# Patient Record
Sex: Male | Born: 2000 | Race: Black or African American | Hispanic: No | Marital: Single | Smoking: Current every day smoker
Health system: Southern US, Community
[De-identification: ages and names within clinical notes are randomized; demographics above are authoritative.]

## PROBLEM LIST (undated history)

## (undated) DIAGNOSIS — F909 Attention-deficit hyperactivity disorder, unspecified type: Secondary | ICD-10-CM

---
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
## 2021-09-20 ENCOUNTER — Emergency Department (HOSPITAL_COMMUNITY)
Admission: EM | Admit: 2021-09-20 | Discharge: 2021-09-21 | Disposition: A | Discharge status: Home / Self Care | Payer: Medicaid Other | Attending: Emergency Medicine | Admitting: Emergency Medicine

## 2021-09-20 ENCOUNTER — Emergency Department (HOSPITAL_COMMUNITY): Payer: Medicaid Other

## 2021-09-20 ENCOUNTER — Other Ambulatory Visit: Payer: Self-pay

## 2021-09-20 DIAGNOSIS — M25512 Pain in left shoulder: Secondary | ICD-10-CM | POA: Diagnosis present

## 2021-09-20 DIAGNOSIS — W228XXA Striking against or struck by other objects, initial encounter: Secondary | ICD-10-CM | POA: Diagnosis not present

## 2021-09-20 MED ORDER — IBUPROFEN 800 MG PO TABS
800.0000 mg | ORAL_TABLET | Freq: Once | ORAL | Status: AC
  Administered 2021-09-20: 800 mg via ORAL
  Filled 2021-09-20: qty 1
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
## 2021-09-21 MED ORDER — OXYCODONE-ACETAMINOPHEN 5-325 MG PO TABS
2.0000 | ORAL_TABLET | Freq: Once | ORAL | Status: AC
  Administered 2021-09-21: 2 via ORAL
  Filled 2021-09-21: qty 2

## 2021-09-21 MED ORDER — LIDOCAINE 5 % EX PTCH
1.0000 | MEDICATED_PATCH | CUTANEOUS | Status: DC
  Administered 2021-09-21: 1 via TRANSDERMAL
  Filled 2021-09-21: qty 1

## 2021-09-21 MED ORDER — KETOROLAC TROMETHAMINE 60 MG/2ML IM SOLN
60.0000 mg | Freq: Once | INTRAMUSCULAR | Status: DC
  Filled 2021-09-21: qty 2
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
## 2021-09-21 NOTE — ED Notes (Signed)
Unable to obtain signature at time of discharge due to no signature pad available for hall bed 

## 2021-09-21 NOTE — Discharge Instructions (Signed)
Wear your sling as needed for comfort. Use ibuprofen or tylenol for pain. Can also utilize heating pad, massage, lidocaine patch or icy hot for discomfort.

## 2022-07-30 IMAGING — CR DG SHOULDER 2+V*L*
3 series · 3 of 3 positions shown · non-contrast
Comparison: None.

CLINICAL DATA: Shoulder trauma

EXAM:
LEFT SHOULDER - 2+ VIEW

[x shoulder axillary left]
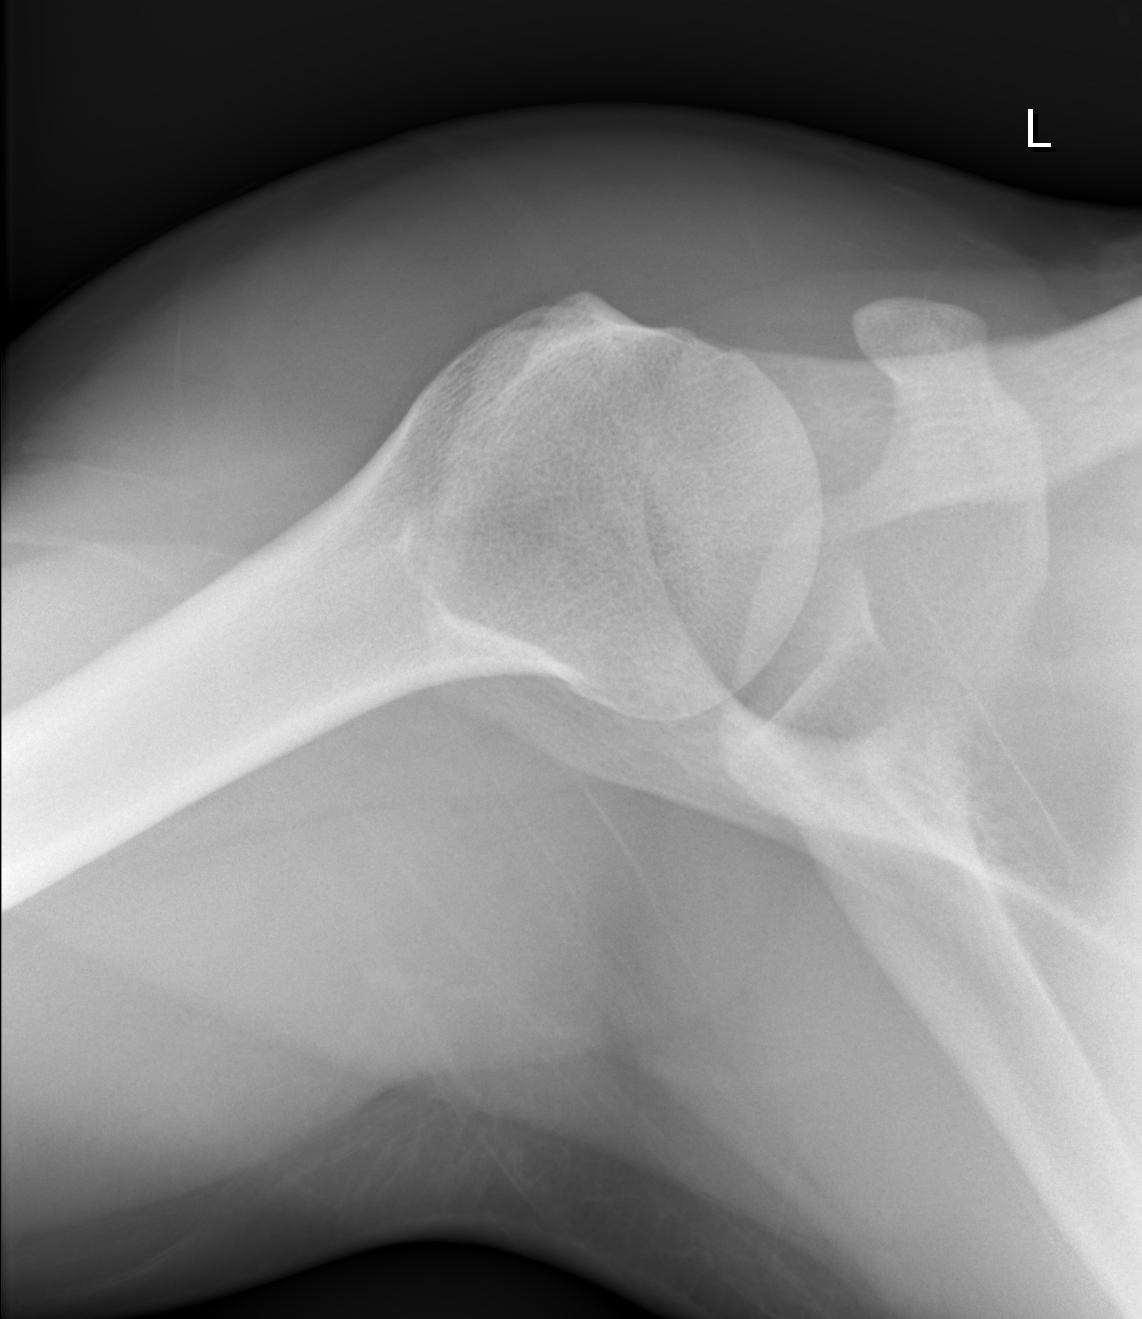

[w shoulder internal left]
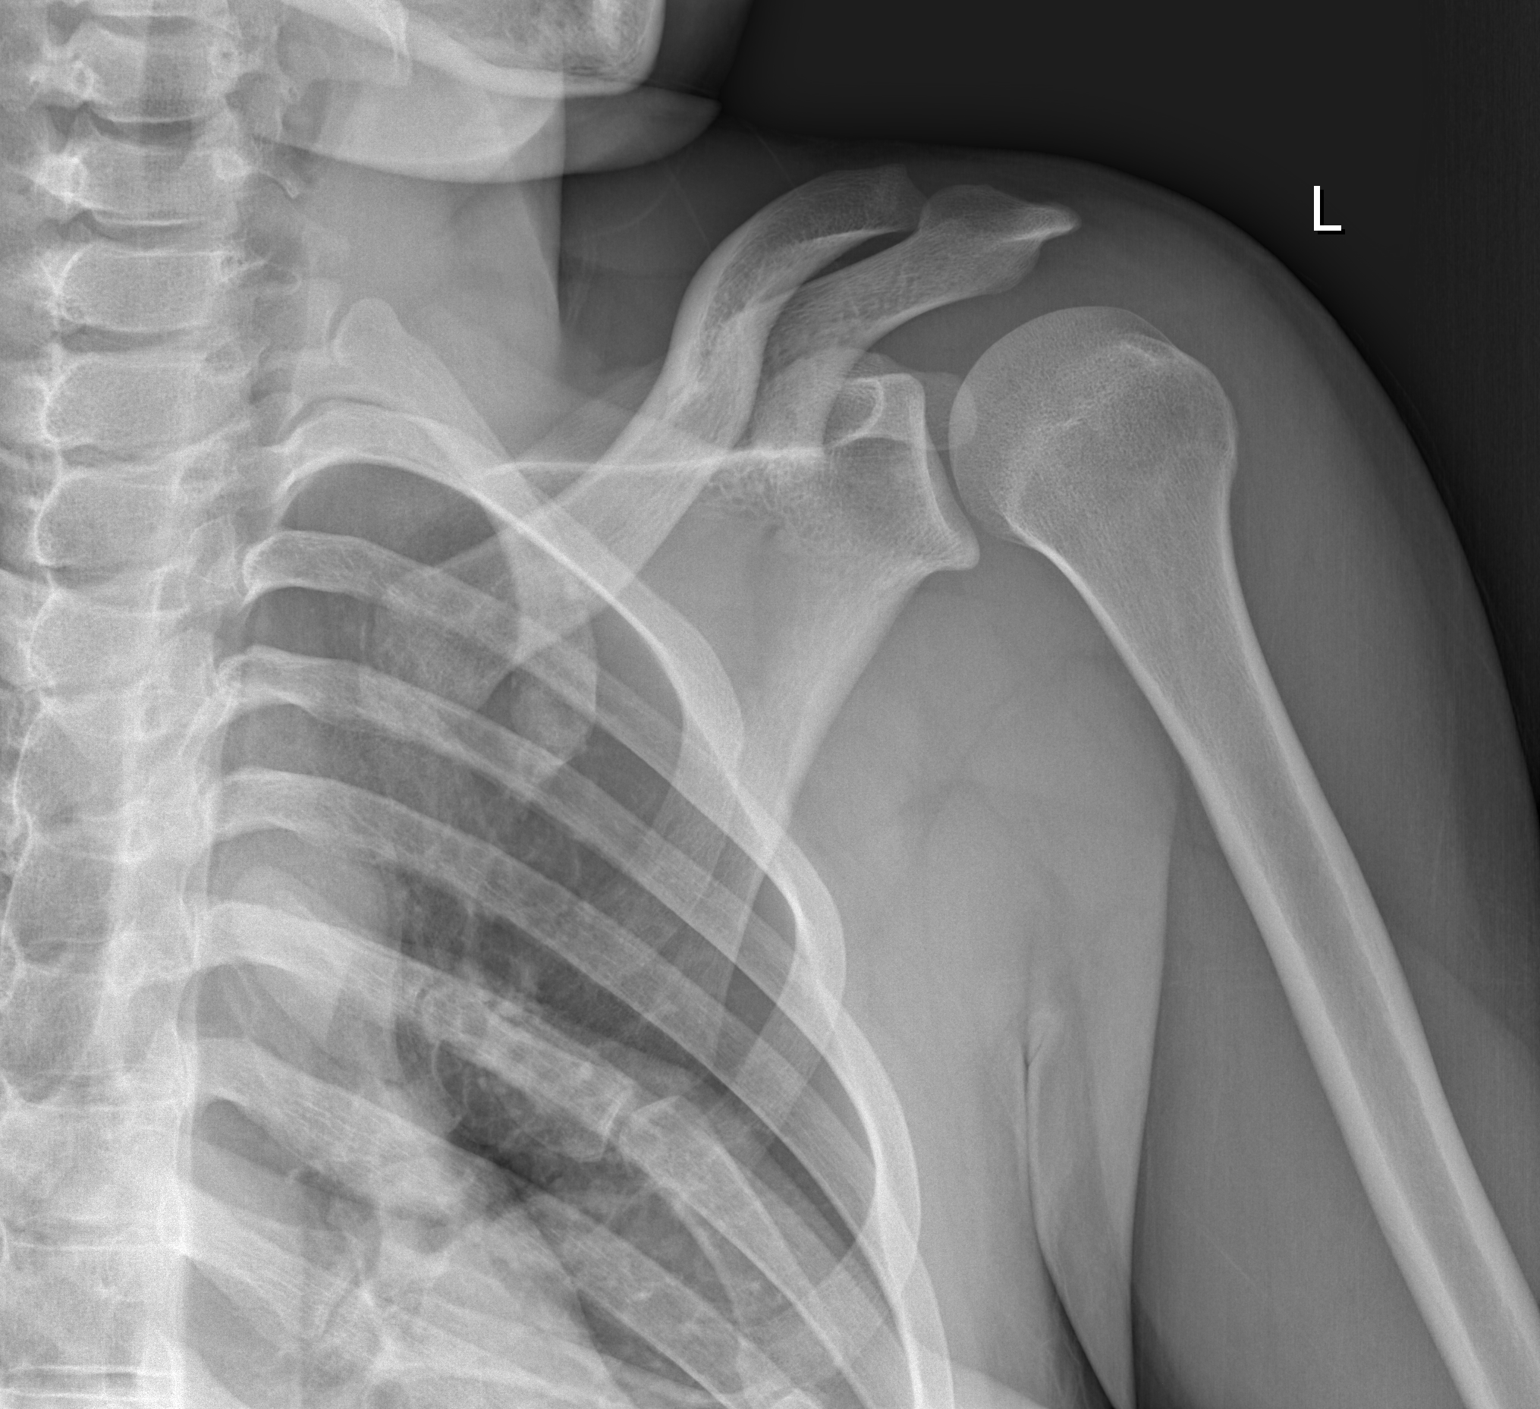

[w shoulder y-view left]
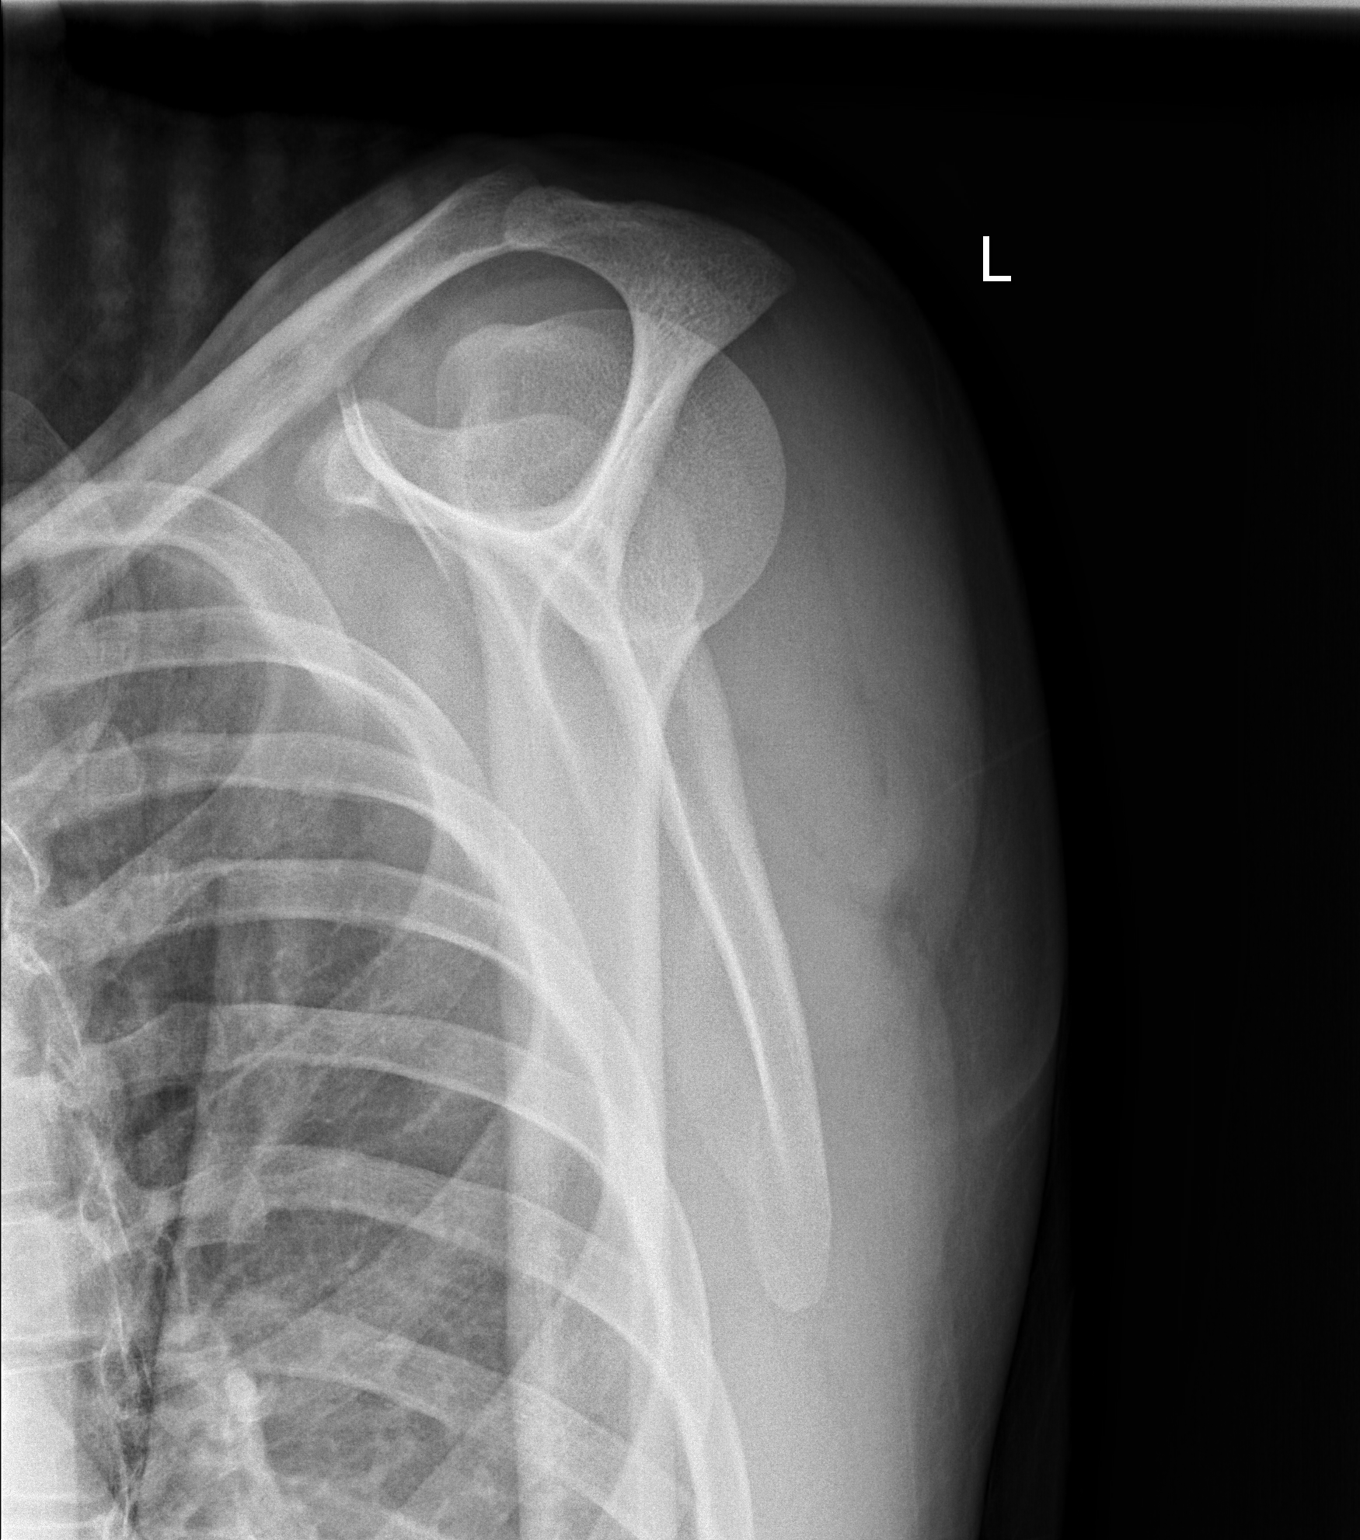

[3 of 3 positions shown; findings below may reference images not displayed]

FINDINGS: There is no evidence of fracture or dislocation. There is no
evidence of arthropathy or other focal bone abnormality. Soft
tissues are unremarkable.
IMPRESSION: Negative.

## 2024-02-04 ENCOUNTER — Emergency Department (HOSPITAL_COMMUNITY)

## 2024-02-04 ENCOUNTER — Other Ambulatory Visit: Payer: Self-pay

## 2024-02-04 ENCOUNTER — Encounter (HOSPITAL_COMMUNITY): Payer: Self-pay | Admitting: *Deleted

## 2024-02-04 ENCOUNTER — Emergency Department (HOSPITAL_COMMUNITY)
Admission: EM | Admit: 2024-02-04 | Discharge: 2024-02-04 | Disposition: A | Discharge status: Home / Self Care | Attending: Emergency Medicine | Admitting: Emergency Medicine

## 2024-02-04 DIAGNOSIS — F17290 Nicotine dependence, other tobacco product, uncomplicated: Secondary | ICD-10-CM | POA: Diagnosis not present

## 2024-02-04 DIAGNOSIS — Y92410 Unspecified street and highway as the place of occurrence of the external cause: Secondary | ICD-10-CM | POA: Diagnosis not present

## 2024-02-04 DIAGNOSIS — M25532 Pain in left wrist: Secondary | ICD-10-CM | POA: Diagnosis present

## 2024-02-04 DIAGNOSIS — S60812A Abrasion of left wrist, initial encounter: Secondary | ICD-10-CM | POA: Diagnosis not present
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
## 2024-02-04 NOTE — Discharge Instructions (Signed)
 William Shaffer:  Thank you for allowing [place] to take care of you today.  We hope you begin feeling better soon.  To-Do: Please follow-up with your primary doctor to discuss any findings from today's emergency department visit. Please return to the Emergency Department or call 911 if you experience chest pain, shortness of breath, severe pain, severe fever, altered mental status, or have any reason to think that you need emergency medical care.  Thank you again.  Hope you feel better soon.  Department of Emergency Medicine [facility]

## 2024-02-04 NOTE — ED Notes (Signed)
 Patient alert and oriented x4. Left hand wrapped. Full ROM to fingers and wrist.
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
## 2024-06-05 ENCOUNTER — Ambulatory Visit
Admission: EM | Admit: 2024-06-05 | Discharge: 2024-06-05 | Discharge status: Against Medical Advice | Attending: Emergency Medicine | Admitting: Emergency Medicine

## 2024-06-05 DIAGNOSIS — R1084 Generalized abdominal pain: Secondary | ICD-10-CM | POA: Diagnosis not present

## 2024-06-05 DIAGNOSIS — S29019A Strain of muscle and tendon of unspecified wall of thorax, initial encounter: Secondary | ICD-10-CM

## 2024-06-05 NOTE — ED Provider Notes (Signed)
 HPI  SUBJECTIVE:  William Shaffer is a 23 y.o. male who presents with 2 issues: First, he reports 4 days of sharp, intermittent, hours long midline thoracic back pain that does not migrate or radiate.  No fevers, coughing, wheezing, shortness of breath, trauma to the area.  He does a lot of repetitive movement, bending at work during his 12-hour shifts.  He tried 200 mg of ibuprofen  twice daily with improvement in his symptoms.  Symptoms are worse at the end of the day.  Second, he reports 2 days of intermittent, bubbly, crampy, nonmigratory, nonradiating epigastric pain that lasts approximately 5 minutes.  He states it feels like he is being kicked.  No radiation through to his back.  States that he has urge to defecate followed by the pain, sometimes he will have a bowel movement which improves his symptoms.  He reports intermittent nausea, decreased appetite.  No chest pain, palpitations, chest pressure or heaviness, fevers, vomiting, abdominal distention, urinary complaints.  He reports nonbloody, watery diarrhea.  No melena.  No raw undercooked foods, questionable leftovers, recent travel, recent antibiotics, contacts with similar symptoms.  No excess NSAID use.  No recent alcohol use.  He drinks alcohol occasionally.  He smokes marijuana/THC, but has not changed the amount or kind that he usually smokes.  He states that the car ride over here was painful.  He tried taking ibuprofen  with some improvement in his abdominal pain.  His pain is also better after stooling.  Symptoms worse with smoking marijuana/THC, movement.  It is not associated with p.o. intake, urination.  Past medical history of smoking and marijuana use.  No history of gastritis, peptic ulcer disease, atrial fibrillation, mesenteric ischemia, abdominal surgeries, pancreatitis, gallbladder disease, MI, CVA, hypercholesterolemia, hypertension, diabetes.  Family history significant for maternal uncle and paternal uncle with MIs in their  30s to 85s.  PCP: Cannot remember.    Past Medical History:  Diagnosis Date   ADHD     History reviewed. No pertinent surgical history.  History reviewed. No pertinent family history.  Social History   Tobacco Use   Smoking status: Every Day    Types: Cigars    Passive exposure: Never   Smokeless tobacco: Never  Vaping Use   Vaping status: Never Used  Substance Use Topics   Alcohol use: Yes    Comment: occ   Drug use: Yes    Types: Marijuana    Comment: smoked 2 days ago.    No current facility-administered medications for this encounter. No current outpatient medications on file.  No Known Allergies   ROS  As noted in HPI.   Physical Exam  BP 111/69 (BP Location: Left Arm)   Pulse (!) 57   Temp 98.4 F (36.9 C) (Oral)   Resp 17   SpO2 100%   Constitutional: Well developed, well nourished, no acute distress Eyes: PERRL, EOMI, conjunctiva normal bilaterally HENT: Normocephalic, atraumatic,mucus membranes moist Respiratory: Clear to auscultation bilaterally, no rales, no wheezing, no rhonchi Cardiovascular: Normal rate and rhythm, no murmurs, no gallops, no rubs GI: Soft, nondistended, normal bowel sounds, diffuse tenderness, particularly in the epigastric, periumbilical, right upper quadrant, left flank and left lower quadrant.  Tenderness maximum in the left lower quadrant.  Normal appearance.  No guarding, rebound. Back: No C-spine, T-spine, L-spine bony tenderness.  Positive bilateral trapezius tenderness.  Positive bilateral midline thoracic tenderness.  No paralumbar tenderness.  No CVAT skin: No rash, skin intact Musculoskeletal: No edema, no tenderness, no deformities Neurologic:  Alert & oriented x 3, CN III-XII grossly intact, no motor deficits, sensation grossly intact Psychiatric: Speech and behavior appropriate   ED Course   Medications - No data to display  No orders of the defined types were placed in this encounter.  No results found  for this or any previous visit (from the past 24 hours). No results found.  ED Clinical Impression  1. Generalized abdominal pain   2. Thoracic myofascial strain, initial encounter      ED Assessment/Plan     1.  Thoracic pain.  I believe this is musculoskeletal.  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  Improves with ibuprofen .  Doubt PE.  Lungs clear, he is satting 100% on room air, no fevers, doubt bilateral pneumonia, pleurisy.  2 abdominal pain.  He has diffuse abdominal tenderness, but the differential is vast including obstruction, perforation, hepatitis, cholecystitis, pancreatitis, UTI, pyelonephritis, nephrolithiasis, gastritis, peptic ulcer disease, ACS, diverticulitis.  Doubt mesenteric ischemia, dissection.  Discussed with patient that we can do a limited workup here to evaluate for some of these entities, but that I think he would be better served in the ED where they can do a comprehensive workup.  Patient refuses workup here or transfer to the emergency department.  He refused an EKG here although I discussed with him my concern for ACS given his family history.  Patient states that he has to go home now, wants a work note at this time, and will go to the emergency department today or tomorrow.  Will sign him out AMA.  Discussed medical decision making, and encouraged him to go to the emergency department ASAP.  Will write a work note for today and tomorrow to give him time to go to the ED.  He agrees with plan.   No orders of the defined types were placed in this encounter.     *This clinic note was created using Dragon dictation software. Therefore, there may be occasional mistakes despite careful proofreading. ?    Van Knee, MD 06/05/24 1322

## 2024-06-05 NOTE — Discharge Instructions (Signed)
 I believe that your back pain is musculoskeletal.  However, I am concerned about your abdominal pain.  It could be a number of different things, including a problem with your heart, pancreatitis, gallbladder problem, problem with your liver, gastritis, peptic ulcer disease, kidney stone, kidney infection, diverticulitis, perforation.  Many of these are emergencies.  I recommend that you go to the emergency department for comprehensive evaluation to rule these out.

## 2024-06-05 NOTE — ED Triage Notes (Addendum)
 Sx x 2 days Abdominal pain. Lower and upper. Last BM today. Diarrhea. Nausea. No vomiting. Diarrhea 3 days   Back pain off and on x 4 days
